# Patient Record
Sex: Male | Born: 1995 | Race: White | Hispanic: No | Marital: Single | Smoking: Never smoker
Health system: Southern US, Community
[De-identification: ages and names within clinical notes are randomized; demographics above are authoritative.]

---
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
## 2021-03-30 HISTORY — PX: OTHER SURGICAL HISTORY: SHX169

## 2021-04-06 DIAGNOSIS — I2699 Other pulmonary embolism without acute cor pulmonale: Secondary | ICD-10-CM

## 2021-04-06 HISTORY — DX: Other pulmonary embolism without acute cor pulmonale: I26.99

## 2021-05-02 ENCOUNTER — Ambulatory Visit
Admission: RE | Admit: 2021-05-02 | Discharge: 2021-05-02 | Disposition: A | Discharge status: Home / Self Care | Payer: Managed Care, Other (non HMO) | Source: Ambulatory Visit | Attending: Family Medicine | Admitting: Family Medicine

## 2021-05-02 ENCOUNTER — Ambulatory Visit (INDEPENDENT_AMBULATORY_CARE_PROVIDER_SITE_OTHER): Payer: Managed Care, Other (non HMO) | Admitting: Family Medicine

## 2021-05-02 ENCOUNTER — Ambulatory Visit
Admission: RE | Admit: 2021-05-02 | Discharge: 2021-05-02 | Disposition: A | Discharge status: Home / Self Care | Payer: Managed Care, Other (non HMO) | Attending: Family Medicine | Admitting: Family Medicine

## 2021-05-02 ENCOUNTER — Other Ambulatory Visit: Payer: Self-pay

## 2021-05-02 ENCOUNTER — Encounter: Payer: Self-pay | Admitting: Family Medicine

## 2021-05-02 VITALS — BP 102/76 | HR 104 | Temp 98.1°F | Ht 76.0 in | Wt 285.0 lb

## 2021-05-02 DIAGNOSIS — G90521 Complex regional pain syndrome I of right lower limb: Secondary | ICD-10-CM | POA: Insufficient documentation

## 2021-05-02 DIAGNOSIS — I2693 Single subsegmental pulmonary embolism without acute cor pulmonale: Secondary | ICD-10-CM | POA: Diagnosis not present

## 2021-05-02 DIAGNOSIS — Z9889 Other specified postprocedural states: Secondary | ICD-10-CM

## 2021-05-02 MED ORDER — DULOXETINE HCL 30 MG PO CPEP: 30.0000 mg | ORAL_CAPSULE | Freq: Every evening | ORAL | 0 refills | Status: AC

## 2021-05-02 MED ORDER — APIXABAN 5 MG PO TABS: 5.0000 mg | ORAL_TABLET | Freq: Two times a day (BID) | ORAL | 2 refills | Status: AC
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
## 2021-05-02 NOTE — Patient Instructions (Signed)
-   Start duloxetine nightly (30 mg) - Monitor for any change and bleeding/bruising as discussed (if noted, discontinue medication and contact our office) - Send a MyChart message to provide a status update in 2 weeks - Recommend scheduled Tylenol (acetaminophen) dosing - Can discuss possible start of physical therapy or if there are any contraindications to physical therapy for the foot/ankle with orthopedics, additionally can discuss medication refills and management with them (oxycodone, need for gabapentin versus transition to alternative) - Return for follow-up in 4 weeks, contact us for any questions between now and then

## 2021-05-18 ENCOUNTER — Encounter: Payer: Self-pay | Admitting: Family Medicine

## 2021-05-19 NOTE — Telephone Encounter (Signed)
Please cancel patient's upcoming appointment per his request.

## 2021-05-19 NOTE — Telephone Encounter (Signed)
Please advise.  Patient taking Eliquis and Cymbalta.

## 2021-05-30 ENCOUNTER — Ambulatory Visit: Payer: Managed Care, Other (non HMO) | Admitting: Family Medicine
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
